# Patient Record
Sex: Male | Born: 1960 | Race: White | Hispanic: No | Marital: Married | Smoking: Current every day smoker
Health system: Southern US, Community
[De-identification: ages and names within clinical notes are randomized; demographics above are authoritative.]

## PROBLEM LIST (undated history)

## (undated) DIAGNOSIS — I10 Essential (primary) hypertension: Secondary | ICD-10-CM

## (undated) DIAGNOSIS — E119 Type 2 diabetes mellitus without complications: Secondary | ICD-10-CM

## (undated) DIAGNOSIS — M1990 Unspecified osteoarthritis, unspecified site: Secondary | ICD-10-CM

## (undated) HISTORY — DX: Essential (primary) hypertension: I10

## (undated) HISTORY — DX: Type 2 diabetes mellitus without complications: E11.9

## (undated) HISTORY — PX: ORCHIECTOMY: SHX2116

---

## 2008-05-12 ENCOUNTER — Encounter: Admission: RE | Admit: 2008-05-12 | Discharge: 2008-05-12 | Payer: Self-pay | Admitting: Internal Medicine

## 2009-06-07 ENCOUNTER — Encounter: Admission: RE | Admit: 2009-06-07 | Discharge: 2009-06-07 | Payer: Self-pay | Admitting: Family Medicine

## 2009-10-23 IMAGING — CR DG CHEST 2V
2 series · 2 of 2 positions shown · non-contrast
Comparison: None

CLINICAL DATA: Physical clearance.

CHEST - 2 VIEW

[view not recorded (1 of 2)]
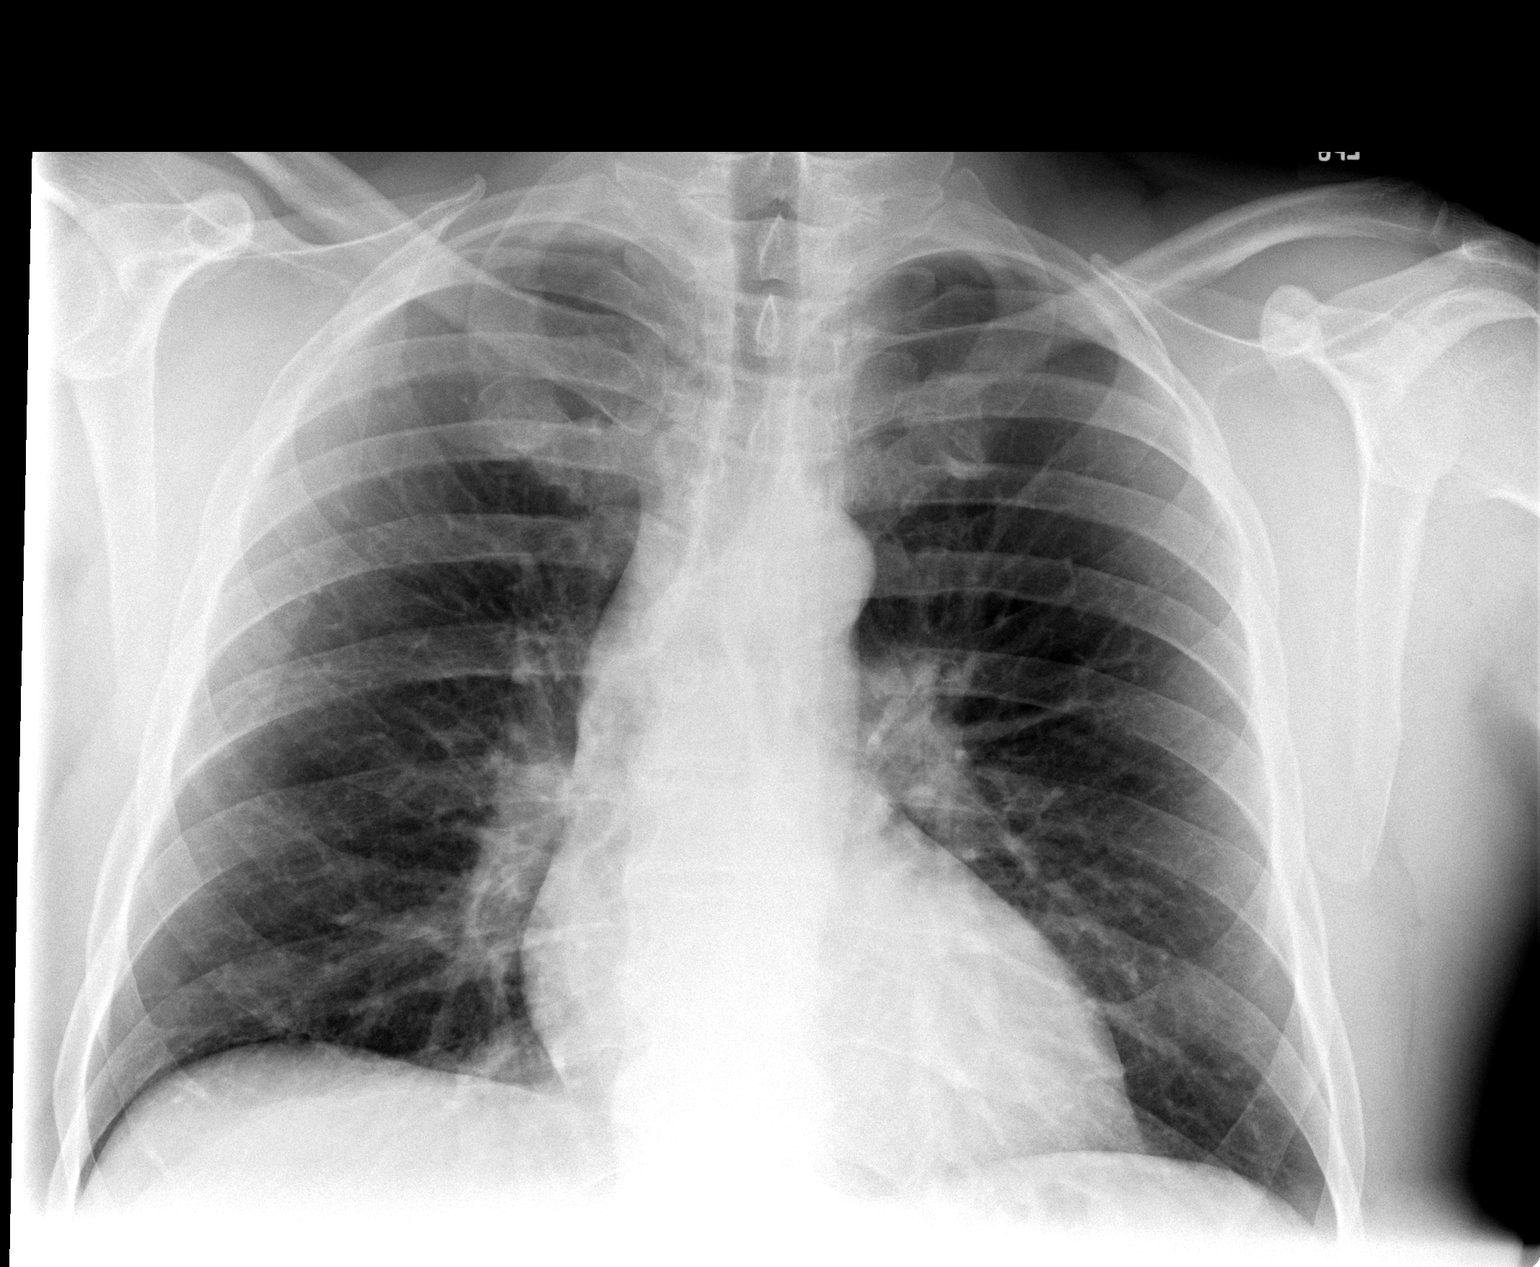

[view not recorded (2 of 2)]
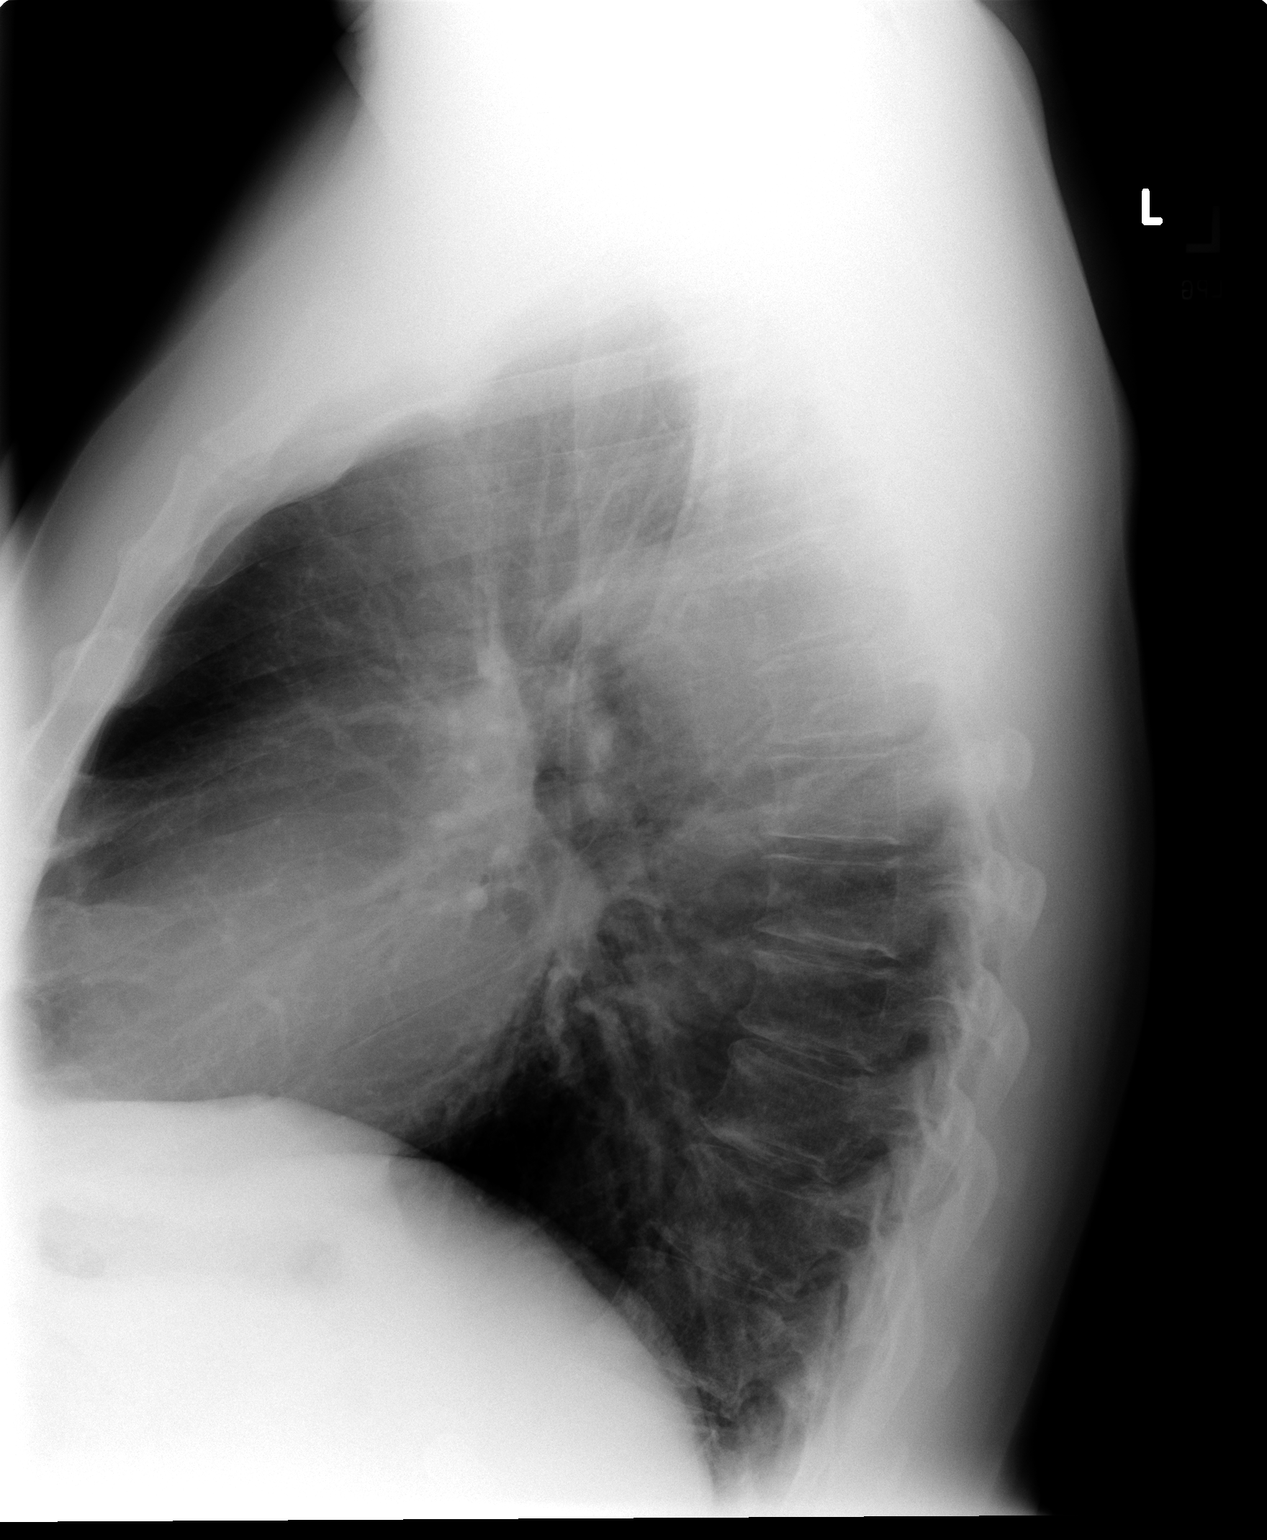

[2 of 2 positions shown; findings below may reference images not displayed]

FINDINGS: The heart size is normal and the vascularity is normal.
The lungs are clear and  there is no infiltrate or effusion.  There
is COPD with pulmonary hyperinflation.
IMPRESSION: No active cardiopulmonary disease.

## 2010-07-16 ENCOUNTER — Encounter: Admission: RE | Admit: 2010-07-16 | Discharge: 2010-07-16 | Payer: Self-pay | Admitting: Occupational Medicine

## 2011-12-27 IMAGING — CR DG CHEST 1V
1 series · 1 of 1 positions shown · non-contrast
Comparison: 06/07/2009

CLINICAL DATA: Physical examination.  Occasional tobacco use.

CHEST - 1 VIEW

[view not recorded]
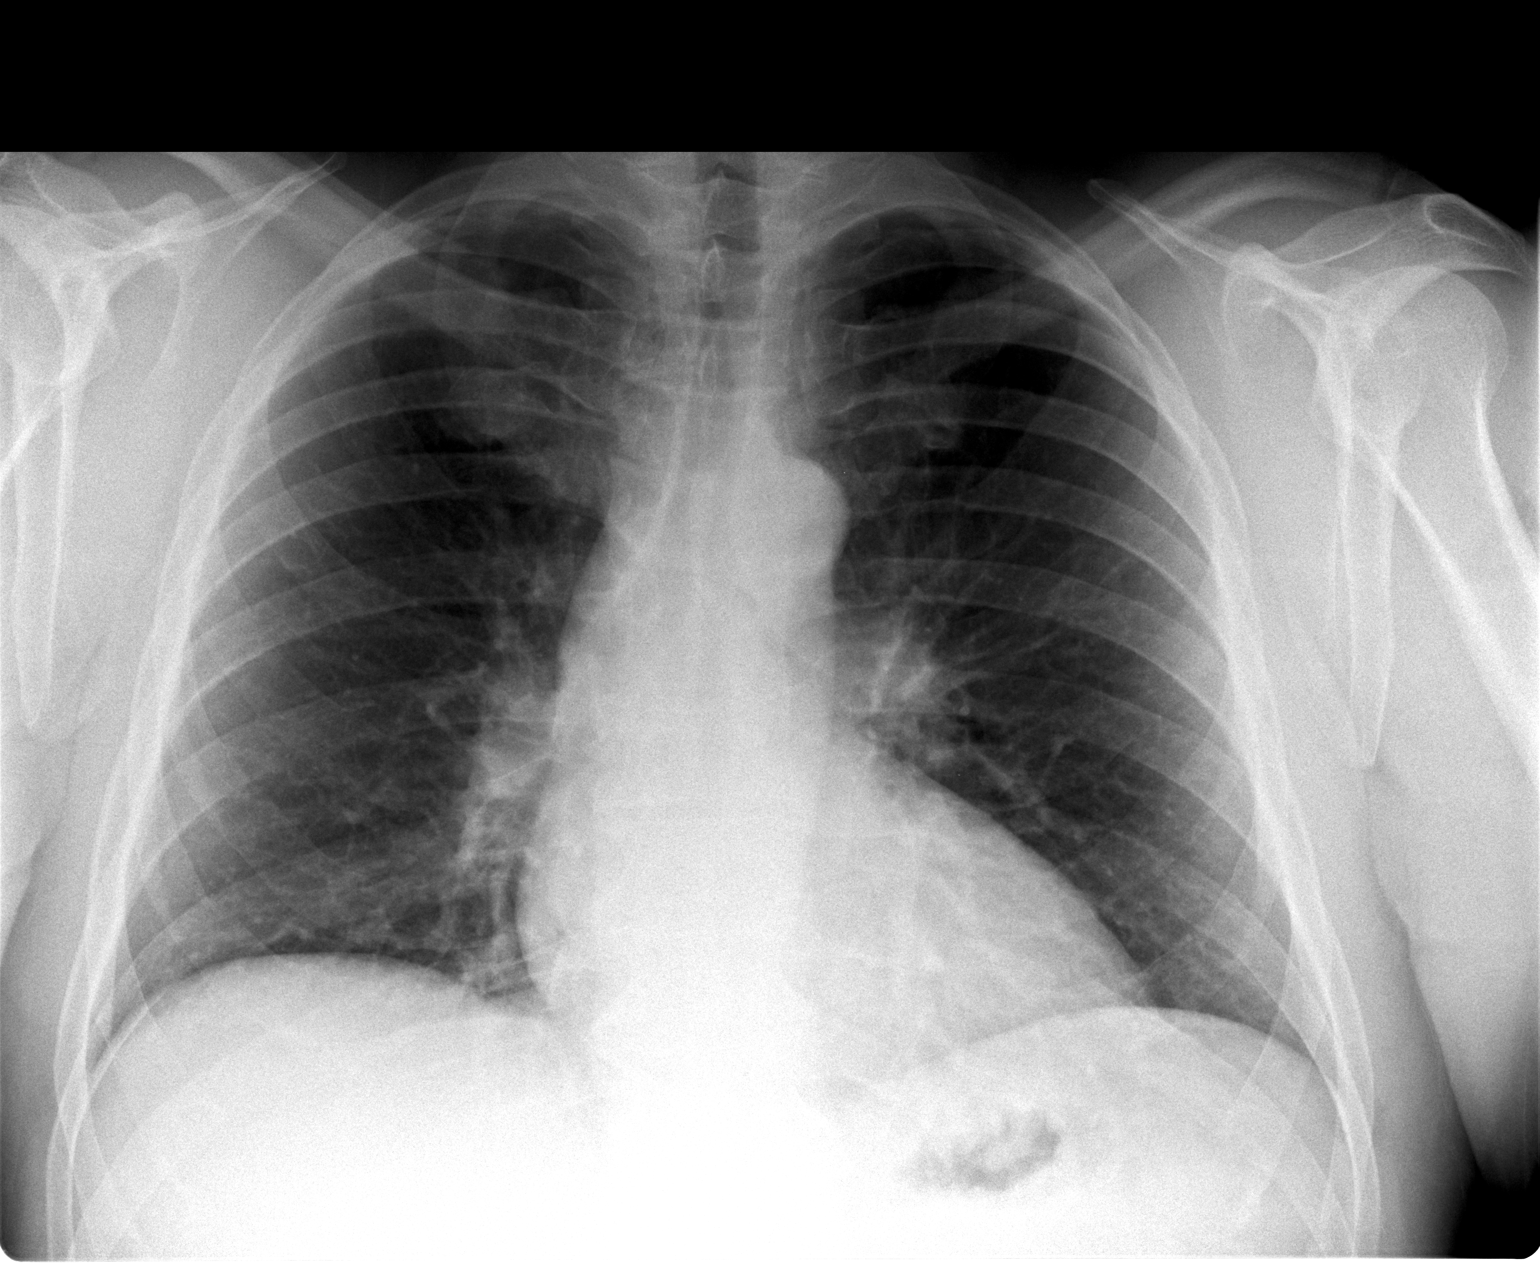

[1 of 1 positions shown; findings below may reference images not displayed]

FINDINGS: The heart size and mediastinal contours are within normal
limits.  Both lungs are clear.  Visualized bony thorax is
unremarkable.
IMPRESSION: No active disease.

## 2022-01-10 ENCOUNTER — Ambulatory Visit (INDEPENDENT_AMBULATORY_CARE_PROVIDER_SITE_OTHER): Payer: Self-pay | Admitting: Urology

## 2022-01-10 ENCOUNTER — Other Ambulatory Visit: Payer: Self-pay

## 2022-01-10 ENCOUNTER — Encounter: Payer: Self-pay | Admitting: Urology

## 2022-01-10 VITALS — BP 163/106 | HR 75

## 2022-01-10 LAB — URINALYSIS, ROUTINE W REFLEX MICROSCOPIC
Bilirubin, UA: NEGATIVE
Ketones, UA: NEGATIVE
Leukocytes,UA: NEGATIVE
Nitrite, UA: NEGATIVE
Protein,UA: NEGATIVE
RBC, UA: NEGATIVE
Specific Gravity, UA: 1.02 (ref 1.005–1.030)
Urobilinogen, Ur: 0.2 mg/dL (ref 0.2–1.0)
pH, UA: 6.5 (ref 5.0–7.5)

## 2022-01-10 LAB — BLADDER SCAN AMB NON-IMAGING: Scan Result: 222

## 2022-01-10 NOTE — Progress Notes (Signed)
PVR 222 ?

## 2022-01-10 NOTE — Progress Notes (Signed)
? ?Assessment: ?1. BPH with obstruction/lower urinary tract symptoms   ?2. Organic impotence   ?3. Peyronie's disease   ?4. Decreased libido   ? ?  ?Plan: ?I reviewed his records from [facility] including prior urology notes, lab results. ?Testosterone level today ?Diagnosis and management options for BPH with obstruction discussed.  He is not responding to medical therapy.  I discussed options including minimally invasive procedures as well as surgical management with TURP, PVP, simple prostatectomy, or HoLEP. ?Recommend further evaluation with cystoscopy and prostate ultrasound for volume. ?I also discussed diagnosis and management of Peyronie's disease.  I advised him that treatment is not recommended in an acute phase when there is pain and ongoing change in the curvature. ?Continue tamsulosin, finasteride, and tadalafil for now. ?Return to office in near future for cystoscopy evaluation. ? ? ?Chief Complaint:  ?Chief Complaint  ?Patient presents with  ? Benign Prostatic Hypertrophy  ? ? ?History of Present Illness: ? ?Mark Kim is a 61 y.o. year old male who is seen for evaluation of BPH with obstruction and organic impotence.  He was previously followed in [facility] with his last visit in June 2021. ?He returns today for follow-up.  He is no longer on Rapaflo.  He has resumed tamsulosin.  He does not feel like his urinary symptoms are well controlled.  He reports urinary frequency, nocturia 1-2 times, intermittent stream, decreased force of stream, urgency, and sensation of incomplete emptying.  No dysuria or gross hematuria. ?He was started on finasteride in January 2023. ?IPSS = 22 today. ?PSA from 1/23: 0.91 ? ?He also continues to have problems achieving and maintaining his erections.  He has noted some pain associated with his erections as well as a dorsal curvature.  This has been ongoing for the past 18 months.  He is able to achieve an erection and use this for intercourse with the  assistance of daily tadalafil.  He does report a decreased libido. ? ?Urologic History: ?He has a history of BPH with obstruction.  He previously had tried alfuzosin but did not notice significant benefit and had ejaculatory problems. He was taking Cialis 5 mg daily with good results as well as Rapaflo. ?Due to worsening of his lower urinary tract symptoms, he was restarted on tamsulosin in October 2020. ?AUA score = 13. ?PSA 10/20: 0.78 ?He was changed back to Rapaflo at his visit in April 2021. ?He noted improvement in his urinary symptoms with the Rapaflo. No side effects. ?AUA score = 6. ? ?He has a history of difficulty achieving and maintaining his erections. No pain or curvature with erection. He also had premature ejaculation, occurring after 2 minutes of penetration. He was on tadalafil 5 mg daily. He was given a trial of tadalafil 20 mg as needed at his visit in April 2021. ?He noted improvement in his erections with the increased dose of tadalafil. He also noted improvement in the premature ejaculation. ? ?He continued on Rapaflo 8 mg daily and tadalafil 20 mg as needed at his visit in June 2021. ? ?Past Medical History:  ?Past Medical History:  ?Diagnosis Date  ? Diabetes mellitus without complication (HCC)   ? Hypertension   ? ? ?Past Surgical History:  ?Past Surgical History:  ?Procedure Laterality Date  ? ORCHIECTOMY    ? ? ?Allergies:  ?No Known Allergies ? ?Family History:  ?History reviewed. No pertinent family history. ? ?Social History:  ?Social History  ? ?Tobacco Use  ? Smoking status: Every  Day  ?  Types: Cigarettes  ? Smokeless tobacco: Never  ?Substance Use Topics  ? Alcohol use: Not Currently  ? ? ?Review of symptoms:  ?Constitutional:  Negative for unexplained weight loss, night sweats, fever, chills ?ENT:  Negative for nose bleeds, sinus pain, painful swallowing ?CV:  Negative for chest pain, shortness of breath, exercise intolerance, palpitations, loss of consciousness ?Resp:  Negative  for cough, wheezing, shortness of breath ?GI:  Negative for nausea, vomiting, diarrhea, bloody stools ?GU:  Positives noted in HPI; otherwise negative for gross hematuria, dysuria ?Neuro:  Negative for seizures, poor balance, limb weakness, slurred speech ?Psych:  Negative for lack of energy, depression, anxiety ?Endocrine:  Negative for polydipsia, polyuria, symptoms of hypoglycemia (dizziness, hunger, sweating) ?Hematologic:  Negative for anemia, purpura, petechia, prolonged or excessive bleeding, use of anticoagulants  ?Allergic:  Negative for difficulty breathing or choking as a result of exposure to anything; no shellfish allergy; no allergic response (rash/itch) to materials, foods ? ?Physical exam: ?BP (!) 163/106   Pulse 75  ?GENERAL APPEARANCE:  Well appearing, well developed, well nourished, NAD ?HEENT: Atraumatic, Normocephalic, oropharynx clear. ?NECK: Supple without lymphadenopathy or thyromegaly. ?LUNGS: Clear to auscultation bilaterally. ?HEART: Regular Rate and Rhythm without murmurs, gallops, or rubs. ?ABDOMEN: Soft, non-tender, No Masses. ?EXTREMITIES: Moves all extremities well.  Without clubbing, cyanosis, or edema. ?NEUROLOGIC:  Alert and oriented x 3, normal gait, CN II-XII grossly intact.  ?MENTAL STATUS:  Appropriate. ?BACK:  Non-tender to palpation.  No CVAT ?SKIN:  Warm, dry and intact.   ?GU: ?Penis: Circumcised; small plaque palpated on dorsal shaft ?Meatus: Normal ?Scrotum: No edema or erythema ?Testis: Solitary testicle without tenderness or mass ?Epididymis: normal ?Prostate: 50 g gland without tenderness or nodularity ?Rectum: Normal tone,  no masses or tenderness ? ? ?Results: ?U/A dipstick negative ? ?PVR:  222 ml ?

## 2022-01-11 LAB — TESTOSTERONE: Testosterone: 629 ng/dL (ref 264–916)

## 2022-01-14 ENCOUNTER — Encounter: Payer: Self-pay | Admitting: Urology

## 2022-01-31 ENCOUNTER — Encounter: Payer: Self-pay | Admitting: Urology

## 2022-01-31 ENCOUNTER — Ambulatory Visit (INDEPENDENT_AMBULATORY_CARE_PROVIDER_SITE_OTHER): Payer: Managed Care, Other (non HMO) | Admitting: Urology

## 2022-01-31 VITALS — BP 113/73 | HR 80

## 2022-01-31 DIAGNOSIS — N401 Enlarged prostate with lower urinary tract symptoms: Secondary | ICD-10-CM

## 2022-01-31 DIAGNOSIS — N138 Other obstructive and reflux uropathy: Secondary | ICD-10-CM

## 2022-01-31 DIAGNOSIS — N529 Male erectile dysfunction, unspecified: Secondary | ICD-10-CM

## 2022-01-31 DIAGNOSIS — N486 Induration penis plastica: Secondary | ICD-10-CM

## 2022-01-31 LAB — URINALYSIS, ROUTINE W REFLEX MICROSCOPIC
Bilirubin, UA: NEGATIVE
Ketones, UA: NEGATIVE
Leukocytes,UA: NEGATIVE
Nitrite, UA: NEGATIVE
Protein,UA: NEGATIVE
RBC, UA: NEGATIVE
Specific Gravity, UA: 1.02 (ref 1.005–1.030)
Urobilinogen, Ur: 0.2 mg/dL (ref 0.2–1.0)
pH, UA: 5.5 (ref 5.0–7.5)

## 2022-01-31 MED ORDER — CIPROFLOXACIN HCL 500 MG PO TABS
500.0000 mg | ORAL_TABLET | Freq: Once | ORAL | Status: AC
  Administered 2022-01-31: 500 mg via ORAL

## 2022-01-31 NOTE — Progress Notes (Signed)
? ?Assessment: ?1. BPH with obstruction/lower urinary tract symptoms   ?2. Organic impotence   ?3. Peyronie's disease   ? ? ?  ?Plan: ?Diagnosis and management options for BPH with obstruction discussed.  He is not responding to medical therapy.  I discussed options including minimally invasive procedures (UroLift) as well as surgical management with TURP, PVP, simple prostatectomy, or HoLEP. ?Recommend further evaluation with prostate ultrasound for volume. ?I also discussed diagnosis and management of Peyronie's disease.  I advised him that treatment is not recommended in an acute phase when there is pain and ongoing change in the curvature. ?Continue tamsulosin, finasteride, and tadalafil for now. ?Cipro x1 following cystoscopy ? ?Chief Complaint:  ?Chief Complaint  ?Patient presents with  ? Benign Prostatic Hypertrophy  ? ? ?History of Present Illness: ? ?Mark Kim is a 61 y.o. year old male who is seen for evaluation of BPH with obstruction and organic impotence.  He was previously followed in [facility] with his last visit in June 2021. ?He discontinued Rapaflo and resumed tamsulosin.  He does not feel like his urinary symptoms are well controlled.  He reports urinary frequency, nocturia 1-2 times, intermittent stream, decreased force of stream, urgency, and sensation of incomplete emptying.  No dysuria or gross hematuria. ?He was started on finasteride in January 2023. ?IPSS = 22.  PVR = 222 ml ?PSA from 1/23: 0.91 ? ?He also continues to have problems achieving and maintaining his erections.  He has noted some pain associated with his erections as well as a dorsal curvature.  This has been ongoing for the past 18 months.  He is able to achieve an erection and use this for intercourse with the assistance of daily tadalafil.  He does report a decreased libido. ?Testosterone 3/23:  629 ? ?He returns today for further evaluation with cystoscopy.  His urinary symptoms remain unchanged.  He continues with  sensation of incomplete emptying, decreased force of stream, frequency, urgency, and intermittent stream.  No dysuria or gross hematuria. ?IPSS = 17. ? ?Urologic History: ?He has a history of BPH with obstruction.  He previously had tried alfuzosin but did not notice significant benefit and had ejaculatory problems. He was taking Cialis 5 mg daily with good results as well as Rapaflo. ?Due to worsening of his lower urinary tract symptoms, he was restarted on tamsulosin in October 2020. ?AUA score = 13. ?PSA 10/20: 0.78 ?He was changed back to Rapaflo at his visit in April 2021. ?He noted improvement in his urinary symptoms with the Rapaflo. No side effects. ?AUA score = 6. ? ?He has a history of difficulty achieving and maintaining his erections. No pain or curvature with erection. He also had premature ejaculation, occurring after 2 minutes of penetration. He was on tadalafil 5 mg daily. He was given a trial of tadalafil 20 mg as needed at his visit in April 2021. ?He noted improvement in his erections with the increased dose of tadalafil. He also noted improvement in the premature ejaculation. ? ?He continued on Rapaflo 8 mg daily and tadalafil 20 mg as needed at his visit in June 2021. ? ? ?Past Medical History:  ?Past Medical History:  ?Diagnosis Date  ? Diabetes mellitus without complication (HCC)   ? Hypertension   ? ? ?Past Surgical History:  ?Past Surgical History:  ?Procedure Laterality Date  ? ORCHIECTOMY    ? ? ?Allergies:  ?No Known Allergies ? ?Family History:  ?No family history on file. ? ?Social History:  ?  Social History  ? ?Tobacco Use  ? Smoking status: Every Day  ?  Types: Cigarettes  ? Smokeless tobacco: Never  ?Substance Use Topics  ? Alcohol use: Not Currently  ? ? ?ROS: ?Constitutional:  Negative for fever, chills, weight loss ?CV: Negative for chest pain, previous MI, hypertension ?Respiratory:  Negative for shortness of breath, wheezing, sleep apnea, frequent cough ?GI:  Negative for nausea,  vomiting, bloody stool, GERD ? ?Physical exam: ?BP 113/73   Pulse 80  ?GENERAL APPEARANCE:  Well appearing, well developed, well nourished, NAD ?HEENT:  Atraumatic, normocephalic, oropharynx clear ?NECK:  Supple without lymphadenopathy or thyromegaly ?ABDOMEN:  Soft, non-tender, no masses ?EXTREMITIES:  Moves all extremities well, without clubbing, cyanosis, or edema ?NEUROLOGIC:  Alert and oriented x 3, normal gait, CN II-XII grossly intact ?MENTAL STATUS:  appropriate ?BACK:  Non-tender to palpation, No CVAT ?SKIN:  Warm, dry, and intact ? ? ?Results: ?U/A dipstick negative. ? ?Procedure:  Flexible Cystourethroscopy ? ?Pre-operative Diagnosis: Lower urinary tract symptoms ? ?Post-operative Diagnosis: Lower urinary tract symptoms ? ?Anesthesia:  local with lidocaine jelly ? ?Surgical Narrative: ? ?After appropriate informed consent was obtained, the patient was prepped and draped in the usual sterile fashion in the supine position.  The patient was correctly identified and the proper procedure delineated prior to proceeding.  Sterile lidocaine gel was instilled in the urethra. ?The flexible cystoscope was introduced without difficulty. ? ?Findings: ? ?Anterior urethra: Normal ? ?Posterior urethra: Lateral lobe hypertrophy, no signficant median lobe ? ?Bladder:  1+ trabeculations, no mucosal lesions ? ?Ureteral orifices: normal ? ?Additional findings:  none ? ?Saline bladder wash for cytology was not performed.   ? ?The cystoscope was then removed.  The patient tolerated the procedure well. ? ? ?

## 2022-02-21 ENCOUNTER — Encounter: Payer: Self-pay | Admitting: Urology

## 2022-02-21 ENCOUNTER — Ambulatory Visit (HOSPITAL_COMMUNITY)
Admission: RE | Admit: 2022-02-21 | Discharge: 2022-02-21 | Disposition: A | Discharge status: Home / Self Care | Payer: Managed Care, Other (non HMO) | Source: Ambulatory Visit | Attending: Urology | Admitting: Urology

## 2022-02-21 ENCOUNTER — Ambulatory Visit: Payer: Managed Care, Other (non HMO) | Admitting: Urology

## 2022-02-21 VITALS — BP 143/87 | HR 76

## 2022-02-21 DIAGNOSIS — N401 Enlarged prostate with lower urinary tract symptoms: Secondary | ICD-10-CM | POA: Diagnosis present

## 2022-02-21 DIAGNOSIS — N529 Male erectile dysfunction, unspecified: Secondary | ICD-10-CM | POA: Diagnosis not present

## 2022-02-21 DIAGNOSIS — N138 Other obstructive and reflux uropathy: Secondary | ICD-10-CM

## 2022-02-21 LAB — URINALYSIS, ROUTINE W REFLEX MICROSCOPIC
Bilirubin, UA: NEGATIVE
Ketones, UA: NEGATIVE
Leukocytes,UA: NEGATIVE
Nitrite, UA: NEGATIVE
RBC, UA: NEGATIVE
Specific Gravity, UA: 1.02 (ref 1.005–1.030)
Urobilinogen, Ur: 1 mg/dL (ref 0.2–1.0)
pH, UA: 7 (ref 5.0–7.5)

## 2022-02-21 LAB — BLADDER SCAN AMB NON-IMAGING: Scan Result: 65

## 2022-02-21 NOTE — Progress Notes (Signed)
? ?Assessment: ?1. BPH with obstruction/lower urinary tract symptoms   ?2. Organic impotence   ? ? ?Plan: ?Diagnosis and management options for BPH with obstruction discussed.  He is not responding to medical therapy.  I discussed options including minimally invasive procedures (UroLift) as well as surgical management with TURP, PVP, simple prostatectomy, or HoLEP. ?He would like to proceed with UroLift.  Risks and benefits discussed.  Information on procedure provided to patient today. ?Continue tamsulosin, finasteride, and tadalafil. ? ?Procedure: ?The patient will be scheduled for cystoscopy, UroLift procedure at [facility].  Surgical request is placed with the surgery schedulers and will be scheduled at the patient's/family request. Informed consent is given as documented below. ?Anesthesia: MAC ? ?The patient does not have sleep apnea, history of MRSA, history of VRE, history of cardiac device requiring special anesthetic needs. ?Patient is stable and considered clear for surgical in an outpatient ambulatory surgery setting as well as patient hospital setting. ? ?Consent for Operation or Procedure: Provider Certification ?I hereby certify that the nature, purpose, benefits, usual and most frequent risks of, and alternatives to, the operation or procedure have been explained to the patient (or person authorized to sign for the patient) either by me as responsible physician or by the provider who is to perform the operation or procedure. Time spent such that the patient/family has had an opportunity to ask questions, and that those questions have been answered. The patient or the patient's representative has been advised that selected tasks may be performed by assistants to the primary health care provider(s). I believe that the patient (or person authorized to sign for the patient) understands what has been explained, and has consented to the operation or procedure. No guarantees were implied or made. ? ?   ? ?Chief Complaint:  ?Chief Complaint  ?Patient presents with  ? Benign Prostatic Hypertrophy  ? ? ?History of Present Illness: ? ?Mark Kim is a 61 y.o. year old male who is seen for evaluation of BPH with obstruction and organic impotence.  He was previously followed in [facility] with his last visit in June 2021. ?He discontinued Rapaflo and resumed tamsulosin.  He does not feel like his urinary symptoms are well controlled.  He reports urinary frequency, nocturia 1-2 times, intermittent stream, decreased force of stream, urgency, and sensation of incomplete emptying.  No dysuria or gross hematuria. ?He was started on finasteride in January 2023. ?IPSS = 22.  PVR = 222 ml ?PSA from 1/23: 0.91 ? ?He also continues to have problems achieving and maintaining his erections.  He has noted some pain associated with his erections as well as a dorsal curvature.  This has been ongoing for the past 18 months.  He is able to achieve an erection and use this for intercourse with the assistance of daily tadalafil.  He does report a decreased libido. ?Testosterone 3/23:  629 ?His urinary symptoms remained unchanged.  He continued with sensation of incomplete emptying, decreased force of stream, frequency, urgency, and intermittent stream.  No dysuria or gross hematuria. ?IPSS = 17. ?Cystoscopy demonstrated lateral lobe enlargement of the prostate without a median lobe, 1+ bladder trabeculations. ?He underwent prostate ultrasound today for surgical planning.   ?TRUS volume: 28 ml ? ?He returns today for follow-up.  He continues on tamsulosin, finasteride, and tadalafil.  His urinary symptoms are unchanged.  He continues to report a decreased force of stream and sensation of incomplete emptying.  No dysuria or gross hematuria. ?IPSS = 14  today. ? ?Urologic History: ?He has a history of BPH with obstruction.  He previously had tried alfuzosin but did not notice significant benefit and had ejaculatory problems. He was  taking Cialis 5 mg daily with good results as well as Rapaflo. ?Due to worsening of his lower urinary tract symptoms, he was restarted on tamsulosin in October 2020. ?AUA score = 13. ?PSA 10/20: 0.78 ?He was changed back to Rapaflo at his visit in April 2021. ?He noted improvement in his urinary symptoms with the Rapaflo. No side effects. ?AUA score = 6. ? ?He has a history of difficulty achieving and maintaining his erections. No pain or curvature with erection. He also had premature ejaculation, occurring after 2 minutes of penetration. He was on tadalafil 5 mg daily. He was given a trial of tadalafil 20 mg as needed at his visit in April 2021. ?He noted improvement in his erections with the increased dose of tadalafil. He also noted improvement in the premature ejaculation. ? ?He continued on Rapaflo 8 mg daily and tadalafil 20 mg as needed at his visit in June 2021. ? ? ?Past Medical History:  ?Past Medical History:  ?Diagnosis Date  ? Diabetes mellitus without complication ([place])   ? Hypertension   ? ? ?Past Surgical History:  ?Past Surgical History:  ?Procedure Laterality Date  ? ORCHIECTOMY    ? ? ?Allergies:  ?No Known Allergies ? ?Family History:  ?No family history on file. ? ?Social History:  ?Social History  ? ?Tobacco Use  ? Smoking status: Every Day  ?  Types: Cigarettes  ? Smokeless tobacco: Never  ?Substance Use Topics  ? Alcohol use: Not Currently  ? ? ?ROS: ?Constitutional:  Negative for fever, chills, weight loss ?CV: Negative for chest pain, previous MI, hypertension ?Respiratory:  Negative for shortness of breath, wheezing, sleep apnea, frequent cough ?GI:  Negative for nausea, vomiting, bloody stool, GERD ? ?Physical exam: ?BP (!) 143/87   Pulse 76  ?GENERAL APPEARANCE:  Well appearing, well developed, well nourished, NAD ?HEENT: Atraumatic, Normocephalic, oropharynx clear. ?NECK: Supple without lymphadenopathy or thyromegaly. ?LUNGS: Clear to auscultation bilaterally. ?HEART: Regular Rate and  Rhythm without murmurs, gallops, or rubs. ?ABDOMEN: Soft, non-tender, No Masses. ?EXTREMITIES: Moves all extremities well.  Without clubbing, cyanosis, or edema. ?NEUROLOGIC:  Alert and oriented x 3, normal gait, CN II-XII grossly intact.  ?MENTAL STATUS:  Appropriate. ?BACK:  Non-tender to palpation.  No CVAT ?SKIN:  Warm, dry and intact.   ? ? ?Results: ?U/A dipstick negative ? ?PVR = 65 mL ? ? ? ?

## 2022-02-22 ENCOUNTER — Telehealth: Payer: Self-pay

## 2022-02-22 NOTE — Telephone Encounter (Signed)
I spoke with Mr. Walrond. We have discussed possible surgery dates and 04/03/2022 was agreed upon by all parties. Patient given information about surgery date, what to expect pre-operatively and post operatively.  ?  ?We discussed that a pre-op nurse will be calling to set up the pre-op visit that will take place prior to surgery. Informed patient that our office will communicate any additional care to be provided after surgery.  ?  ?Patients questions or concerns were discussed during our call. Advised to call our office should there be any additional information, questions or concerns that arise. Patient verbalized understanding.   ?

## 2022-03-21 ENCOUNTER — Telehealth: Payer: Self-pay

## 2022-03-21 ENCOUNTER — Other Ambulatory Visit: Payer: Self-pay

## 2022-03-21 ENCOUNTER — Other Ambulatory Visit: Payer: Self-pay | Admitting: Urology

## 2022-03-21 DIAGNOSIS — N138 Other obstructive and reflux uropathy: Secondary | ICD-10-CM

## 2022-03-21 MED ORDER — SILODOSIN 8 MG PO CAPS: 8.0000 mg | ORAL_CAPSULE | Freq: Every day | ORAL | 11 refills | Status: DC

## 2022-03-21 NOTE — Telephone Encounter (Signed)
Rx sent 

## 2022-03-21 NOTE — Telephone Encounter (Signed)
Patient called back wanting to check on status of RX. Medication is Rapeflo.      Ph[facility]: [facility] [address], [place], [place] [address]

## 2022-03-21 NOTE — Telephone Encounter (Signed)
Patient left a voice mail today.  He is out of medication. Did not leave name of Rx.  Please call back:  [phone]  Thanks, Rosey Bath

## 2022-03-21 NOTE — Telephone Encounter (Signed)
Is patient suppose to be on Rapaflo? Medication not listed to continue per last OV note. Please advise.

## 2022-04-01 NOTE — Patient Instructions (Signed)
Mark Kim  04/01/2022     @   Your procedure is scheduled on  04/03/2022.   Report to [facility] at  0600 A.M.   Call this number if you have problems the morning of surgery:  [phone]   Remember:  Do not eat or drink after midnight.     Take these medicines the morning of surgery with A SIP OF WATER                                           prilosec.     Do not wear jewelry, make-up or nail polish.  Do not wear lotions, powders, or perfumes, or deodorant.  Do not shave 48 hours prior to surgery.  Men may shave face and neck.  Do not bring valuables to the hospital.  [facility] is not responsible for any belongings or valuables.  Contacts, dentures or bridgework may not be worn into surgery.  Leave your suitcase in the car.  After surgery it may be brought to your room.  For patients admitted to the hospital, discharge time will be determined by your treatment team.  Patients discharged the day of surgery will not be allowed to drive home and must have someone with them for 24 hours.    Special instructions:   DO NOT smoke tobacco or vape for 24 hours before your procedure.  Please read over the following fact sheets that you were given. Coughing and Deep Breathing, Surgical Site Infection Prevention, Anesthesia Post-op Instructions, and Care and Recovery After Surgery      Prostatic Urethral Lift, Care After The following information offers guidance on how to care for yourself after your procedure. Your health care provider may also give you more specific instructions. If you have problems or questions, contact your health care provider. What can I expect after the procedure? After the procedure, it is common to have: Soreness or discomfort in your penis from having the cystoscope inserted during the procedure. Discomfort or burning when urinating. An increased urge to urinate. More frequent urination. Urine that is  blood-tinged. These symptoms should go away after a few days. Follow these instructions at home: Activity  If you were given a sedative during the procedure, it can affect you for several hours. Do not drive or operate machinery until your health care provider says that it is safe. Avoid sitting for a long time without moving. Get up to take short walks every 1-2 hours. This is important to improve blood flow and breathing. Ask for help if you feel weak or unsteady. You may have to avoid lifting. Ask your health care provider how much you can safely lift. Avoid intense physical activity for as long as told by your health care provider. Return to your normal activities as told by your health care provider. Ask your health care provider what activities are safe for you. Ask when you can return to sexual activity. General instructions  Take over-the-counter and prescription medicines only as told by your health care provider. Ask your health care provider if the medicine prescribed to you: Requires you to avoid driving or using machinery. Can cause constipation. You may need to take these actions to prevent or treat constipation: Drink enough fluid to keep your urine pale yellow. Take over-the-counter or prescription medicines. Eat foods that are  high in fiber, such as beans, whole grains, and fresh fruits and vegetables. Limit foods that are high in fat and processed sugars, such as fried or sweet foods. Do not use any products that contain nicotine or tobacco. These products include cigarettes, chewing tobacco, and vaping devices, such as e-cigarettes. These can delay healing after the procedure. If you need help quitting, ask your health care provider. Keep all follow-up visits. This is important. Contact a health care provider if: You have chills or a fever. You have pain when passing urine. You have bright red blood or blood clots in your urine. You have difficulty passing urine. You  have leaking of urine (incontinence). Get help right away if: You have chest pain or shortness of breath. You have leg pain or swelling. You cannot pass urine. These symptoms may be an emergency. Get help right away. Call 911. Do not wait to see if the symptoms will go away. Do not drive yourself to the hospital. Summary After the procedure, it is common to have discomfort or burning when urinating, an increased urge to urinate, more frequent urination, and urine that is blood-tinged. You may have to avoid lifting. Ask your health care provider how much you can safely lift. Return to your normal activities as told by your health care provider. Ask when you can return to sexual activity. This information is not intended to replace advice given to you by your health care provider. Make sure you discuss any questions you have with your health care provider. Document Revised: 05/11/2021 Document Reviewed: 05/11/2021 Elsevier Patient Education  2023 [employer] Inc[employer] General Anesthesia, Adult, Care After This sheet gives you information about how to care for yourself after your procedure. Your health care provider may also give you more specific instructions. If you have problems or questions, contact your health care provider. What can I expect after the procedure? After the procedure, the following side effects are common: Pain or discomfort at the IV site. Nausea. Vomiting. Sore throat. Trouble concentrating. Feeling cold or chills. Feeling weak or tired. Sleepiness and fatigue. Soreness and body aches. These side effects can affect parts of the body that were not involved in surgery. Follow these instructions at home: For the time period you were told by your health care provider:  Rest. Do not participate in activities where you could fall or become injured. Do not drive or use machinery. Do not drink alcohol. Do not take sleeping pills or medicines that cause drowsiness. Do not make  important decisions or sign legal documents. Do not take care of children on your own. Eating and drinking Follow any instructions from your health care provider about eating or drinking restrictions. When you feel hungry, start by eating small amounts of foods that are soft and easy to digest (bland), such as toast. Gradually return to your regular diet. Drink enough fluid to keep your urine pale yellow. If you vomit, rehydrate by drinking water, juice, or clear broth. General instructions If you have sleep apnea, surgery and certain medicines can increase your risk for breathing problems. Follow instructions from your health care provider about wearing your sleep device: Anytime you are sleeping, including during daytime naps. While taking prescription pain medicines, sleeping medicines, or medicines that make you drowsy. Have a responsible adult stay with you for the time you are told. It is important to have someone help care for you until you are awake and alert. Return to your normal activities as told by your health care  provider. Ask your health care provider what activities are safe for you. Take over-the-counter and prescription medicines only as told by your health care provider. If you smoke, do not smoke without supervision. Keep all follow-up visits as told by your health care provider. This is important. Contact a health care provider if: You have nausea or vomiting that does not get better with medicine. You cannot eat or drink without vomiting. You have pain that does not get better with medicine. You are unable to pass urine. You develop a skin rash. You have a fever. You have redness around your IV site that gets worse. Get help right away if: You have difficulty breathing. You have chest pain. You have blood in your urine or stool, or you vomit blood. Summary After the procedure, it is common to have a sore throat or nausea. It is also common to feel tired. Have a  responsible adult stay with you for the time you are told. It is important to have someone help care for you until you are awake and alert. When you feel hungry, start by eating small amounts of foods that are soft and easy to digest (bland), such as toast. Gradually return to your regular diet. Drink enough fluid to keep your urine pale yellow. Return to your normal activities as told by your health care provider. Ask your health care provider what activities are safe for you. This information is not intended to replace advice given to you by your health care provider. Make sure you discuss any questions you have with your health care provider. Document Revised: 06/29/2020 Document Reviewed: 01/27/2020 Elsevier Patient Education  2023 Elsevier Inc. How to Use Chlorhexidine for Bathing Chlorhexidine gluconate (CHG) is a germ-killing (antiseptic) solution that is used to clean the skin. It can get rid of the bacteria that normally live on the skin and can keep them away for about 24 hours. To clean your skin with CHG, you may be given: A CHG solution to use in the shower or as part of a sponge bath. A prepackaged cloth that contains CHG. Cleaning your skin with CHG may help lower the risk for infection: While you are staying in the intensive care unit of the hospital. If you have a vascular access, such as a central line, to provide short-term or long-term access to your veins. If you have a catheter to drain urine from your bladder. If you are on a ventilator. A ventilator is a machine that helps you breathe by moving air in and out of your lungs. After surgery. What are the risks? Risks of using CHG include: A skin reaction. Hearing loss, if CHG gets in your ears and you have a perforated eardrum. Eye injury, if CHG gets in your eyes and is not rinsed out. The CHG product catching fire. Make sure that you avoid smoking and flames after applying CHG to your skin. Do not use CHG: If you have  a chlorhexidine allergy or have previously reacted to chlorhexidine. On babies younger than 36 months of age. How to use CHG solution Use CHG only as told by your health care provider, and follow the instructions on the label. Use the full amount of CHG as directed. Usually, this is one bottle. During a shower Follow these steps when using CHG solution during a shower (unless your health care provider gives you different instructions): Start the shower. Use your normal soap and shampoo to wash your face and hair. Turn off the shower or  move out of the shower stream. Pour the CHG onto a clean washcloth. Do not use any type of brush or rough-edged sponge. Starting at your neck, lather your body down to your toes. Make sure you follow these instructions: If you will be having surgery, pay special attention to the part of your body where you will be having surgery. Scrub this area for at least 1 minute. Do not use CHG on your head or face. If the solution gets into your ears or eyes, rinse them well with water. Avoid your genital area. Avoid any areas of skin that have broken skin, cuts, or scrapes. Scrub your back and under your arms. Make sure to wash skin folds. Let the lather sit on your skin for 1-2 minutes or as long as told by your health care provider. Thoroughly rinse your entire body in the shower. Make sure that all body creases and crevices are rinsed well. Dry off with a clean towel. Do not put any substances on your body afterward--such as powder, lotion, or perfume--unless you are told to do so by your health care provider. Only use lotions that are recommended by the manufacturer. Put on clean clothes or pajamas. If it is the night before your surgery, sleep in clean sheets.  During a sponge bath Follow these steps when using CHG solution during a sponge bath (unless your health care provider gives you different instructions): Use your normal soap and shampoo to wash your face and  hair. Pour the CHG onto a clean washcloth. Starting at your neck, lather your body down to your toes. Make sure you follow these instructions: If you will be having surgery, pay special attention to the part of your body where you will be having surgery. Scrub this area for at least 1 minute. Do not use CHG on your head or face. If the solution gets into your ears or eyes, rinse them well with water. Avoid your genital area. Avoid any areas of skin that have broken skin, cuts, or scrapes. Scrub your back and under your arms. Make sure to wash skin folds. Let the lather sit on your skin for 1-2 minutes or as long as told by your health care provider. Using a different clean, wet washcloth, thoroughly rinse your entire body. Make sure that all body creases and crevices are rinsed well. Dry off with a clean towel. Do not put any substances on your body afterward--such as powder, lotion, or perfume--unless you are told to do so by your health care provider. Only use lotions that are recommended by the manufacturer. Put on clean clothes or pajamas. If it is the night before your surgery, sleep in clean sheets. How to use CHG prepackaged cloths Only use CHG cloths as told by your health care provider, and follow the instructions on the label. Use the CHG cloth on clean, dry skin. Do not use the CHG cloth on your head or face unless your health care provider tells you to. When washing with the CHG cloth: Avoid your genital area. Avoid any areas of skin that have broken skin, cuts, or scrapes. Before surgery Follow these steps when using a CHG cloth to clean before surgery (unless your health care provider gives you different instructions): Using the CHG cloth, vigorously scrub the part of your body where you will be having surgery. Scrub using a back-and-forth motion for 3 minutes. The area on your body should be completely wet with CHG when you are done scrubbing. Do not  rinse. Discard the cloth and  let the area air-dry. Do not put any substances on the area afterward, such as powder, lotion, or perfume. Put on clean clothes or pajamas. If it is the night before your surgery, sleep in clean sheets.  For general bathing Follow these steps when using CHG cloths for general bathing (unless your health care provider gives you different instructions). Use a separate CHG cloth for each area of your body. Make sure you wash between any folds of skin and between your fingers and toes. Wash your body in the following order, switching to a new cloth after each step: The front of your neck, shoulders, and chest. Both of your arms, under your arms, and your hands. Your stomach and groin area, avoiding the genitals. Your right leg and foot. Your left leg and foot. The back of your neck, your back, and your buttocks. Do not rinse. Discard the cloth and let the area air-dry. Do not put any substances on your body afterward--such as powder, lotion, or perfume--unless you are told to do so by your health care provider. Only use lotions that are recommended by the manufacturer. Put on clean clothes or pajamas. Contact a health care provider if: Your skin gets irritated after scrubbing. You have questions about using your solution or cloth. You swallow any chlorhexidine. Call your local poison control center ([phone] in the [place] Get help right away if: Your eyes itch badly, or they become very red or swollen. Your skin itches badly and is red or swollen. Your hearing changes. You have trouble seeing. You have swelling or tingling in your mouth or throat. You have trouble breathing. These symptoms may represent a serious problem that is an emergency. Do not wait to see if the symptoms will go away. Get medical help right away. Call your local emergency services (911 in the [place] Do not drive yourself to the hospital. Summary Chlorhexidine gluconate (CHG) is a germ-killing (antiseptic) solution  that is used to clean the skin. Cleaning your skin with CHG may help to lower your risk for infection. You may be given CHG to use for bathing. It may be in a bottle or in a prepackaged cloth to use on your skin. Carefully follow your health care provider's instructions and the instructions on the product label. Do not use CHG if you have a chlorhexidine allergy. Contact your health care provider if your skin gets irritated after scrubbing. This information is not intended to replace advice given to you by your health care provider. Make sure you discuss any questions you have with your health care provider. Document Revised: 12/25/2020 Document Reviewed: 12/25/2020 Elsevier Patient Education  2023 [employer]

## 2022-04-02 ENCOUNTER — Encounter (HOSPITAL_COMMUNITY): Payer: Self-pay

## 2022-04-02 ENCOUNTER — Encounter (HOSPITAL_COMMUNITY)
Admission: RE | Admit: 2022-04-02 | Discharge: 2022-04-02 | Disposition: A | Discharge status: Home / Self Care | Payer: Managed Care, Other (non HMO) | Source: Ambulatory Visit | Attending: Urology | Admitting: Urology

## 2022-04-02 VITALS — BP 123/89 | HR 66 | Temp 97.8°F | Resp 18

## 2022-04-02 DIAGNOSIS — Z01818 Encounter for other preprocedural examination: Secondary | ICD-10-CM | POA: Insufficient documentation

## 2022-04-02 DIAGNOSIS — E119 Type 2 diabetes mellitus without complications: Secondary | ICD-10-CM | POA: Insufficient documentation

## 2022-04-02 DIAGNOSIS — I10 Essential (primary) hypertension: Secondary | ICD-10-CM

## 2022-04-02 HISTORY — DX: Unspecified osteoarthritis, unspecified site: M19.90

## 2022-04-02 LAB — HEMOGLOBIN A1C
Hgb A1c MFr Bld: 7.8 % — ABNORMAL HIGH (ref 4.8–5.6)
Mean Plasma Glucose: 177.16 mg/dL

## 2022-04-02 LAB — BASIC METABOLIC PANEL
Anion gap: 11 (ref 5–15)
BUN: 15 mg/dL (ref 6–20)
CO2: 22 mmol/L (ref 22–32)
Calcium: 9.3 mg/dL (ref 8.9–10.3)
Chloride: 106 mmol/L (ref 98–111)
Creatinine, Ser: 0.77 mg/dL (ref 0.61–1.24)
GFR, Estimated: >60 mL/min (ref >60)
Glucose, Bld: 142 mg/dL — ABNORMAL HIGH (ref 70–99)
Potassium: 4.3 mmol/L (ref 3.5–5.1)
Sodium: 139 mmol/L (ref 135–145)

## 2022-04-03 ENCOUNTER — Encounter (HOSPITAL_COMMUNITY)
Admission: RE | Disposition: A | Discharge status: Home / Self Care | Payer: Self-pay | Source: Ambulatory Visit | Attending: Urology

## 2022-04-03 ENCOUNTER — Encounter (HOSPITAL_COMMUNITY): Payer: Self-pay | Admitting: Urology

## 2022-04-03 ENCOUNTER — Ambulatory Visit (HOSPITAL_COMMUNITY): Payer: Managed Care, Other (non HMO) | Admitting: Certified Registered Nurse Anesthetist

## 2022-04-03 ENCOUNTER — Ambulatory Visit (HOSPITAL_BASED_OUTPATIENT_CLINIC_OR_DEPARTMENT_OTHER): Payer: Managed Care, Other (non HMO) | Admitting: Certified Registered Nurse Anesthetist

## 2022-04-03 ENCOUNTER — Ambulatory Visit (HOSPITAL_COMMUNITY)
Admission: RE | Admit: 2022-04-03 | Discharge: 2022-04-03 | Disposition: A | Discharge status: Home / Self Care | Payer: Managed Care, Other (non HMO) | Source: Ambulatory Visit | Attending: Urology | Admitting: Urology

## 2022-04-03 DIAGNOSIS — I10 Essential (primary) hypertension: Secondary | ICD-10-CM | POA: Diagnosis not present

## 2022-04-03 DIAGNOSIS — N401 Enlarged prostate with lower urinary tract symptoms: Secondary | ICD-10-CM | POA: Insufficient documentation

## 2022-04-03 DIAGNOSIS — N3289 Other specified disorders of bladder: Secondary | ICD-10-CM | POA: Insufficient documentation

## 2022-04-03 DIAGNOSIS — N400 Enlarged prostate without lower urinary tract symptoms: Secondary | ICD-10-CM | POA: Diagnosis not present

## 2022-04-03 DIAGNOSIS — N138 Other obstructive and reflux uropathy: Secondary | ICD-10-CM

## 2022-04-03 DIAGNOSIS — N529 Male erectile dysfunction, unspecified: Secondary | ICD-10-CM | POA: Diagnosis not present

## 2022-04-03 DIAGNOSIS — F17210 Nicotine dependence, cigarettes, uncomplicated: Secondary | ICD-10-CM | POA: Diagnosis not present

## 2022-04-03 HISTORY — PX: CYSTOSCOPY WITH INSERTION OF UROLIFT: SHX6678

## 2022-04-03 LAB — GLUCOSE, CAPILLARY
Glucose-Capillary: 161 mg/dL — ABNORMAL HIGH (ref 70–99)
Glucose-Capillary: 160 mg/dL — ABNORMAL HIGH (ref 70–99)

## 2022-04-03 SURGERY — CYSTOSCOPY WITH INSERTION OF UROLIFT
Anesthesia: General | Site: Prostate

## 2022-04-03 MED ORDER — CIPROFLOXACIN HCL 500 MG PO TABS: 500.0000 mg | ORAL_TABLET | Freq: Two times a day (BID) | ORAL | 0 refills | Status: AC

## 2022-04-03 MED ORDER — LIDOCAINE HCL (PF) 2 % IJ SOLN
INTRAMUSCULAR | Status: AC
  Filled 2022-04-03: qty 5

## 2022-04-03 MED ORDER — PHENAZOPYRIDINE HCL 200 MG PO TABS: 200.0000 mg | ORAL_TABLET | Freq: Three times a day (TID) | ORAL | 1 refills | Status: DC | PRN

## 2022-04-03 MED ORDER — ONDANSETRON HCL 4 MG/2ML IJ SOLN
INTRAMUSCULAR | Status: DC | PRN
  Administered 2022-04-03: 4 mg via INTRAVENOUS

## 2022-04-03 MED ORDER — ONDANSETRON HCL 4 MG/2ML IJ SOLN
INTRAMUSCULAR | Status: AC
  Filled 2022-04-03: qty 2

## 2022-04-03 MED ORDER — LIDOCAINE HCL URETHRAL/MUCOSAL 2 % EX GEL
CUTANEOUS | Status: AC
Start: 2022-04-03 — End: ?
  Filled 2022-04-03: qty 10

## 2022-04-03 MED ORDER — CIPROFLOXACIN IN D5W 400 MG/200ML IV SOLN
400.0000 mg | INTRAVENOUS | Status: AC
  Administered 2022-04-03: 400 mg via INTRAVENOUS
  Filled 2022-04-03: qty 200

## 2022-04-03 MED ORDER — PROPOFOL 10 MG/ML IV BOLUS
INTRAVENOUS | Status: AC
Start: 2022-04-03 — End: ?
  Filled 2022-04-03: qty 20

## 2022-04-03 MED ORDER — LACTATED RINGERS IV SOLN: INTRAVENOUS | Status: DC

## 2022-04-03 MED ORDER — MIDAZOLAM HCL 2 MG/2ML IJ SOLN
INTRAMUSCULAR | Status: AC
  Filled 2022-04-03: qty 2

## 2022-04-03 MED ORDER — PROPOFOL 10 MG/ML IV BOLUS
INTRAVENOUS | Status: DC | PRN
  Administered 2022-04-03: 200 mg via INTRAVENOUS

## 2022-04-03 MED ORDER — LIDOCAINE HCL (CARDIAC) PF 100 MG/5ML IV SOSY
PREFILLED_SYRINGE | INTRAVENOUS | Status: DC | PRN
  Administered 2022-04-03: 80 mg via INTRAVENOUS

## 2022-04-03 MED ORDER — WATER FOR IRRIGATION, STERILE IR SOLN
Status: DC | PRN
  Administered 2022-04-03: 500 mL

## 2022-04-03 MED ORDER — MIDAZOLAM HCL 2 MG/2ML IJ SOLN
INTRAMUSCULAR | Status: DC | PRN
  Administered 2022-04-03: 2 mg via INTRAVENOUS

## 2022-04-03 MED ORDER — STERILE WATER FOR IRRIGATION IR SOLN
Status: DC | PRN
  Administered 2022-04-03 (×2): 3000 mL

## 2022-04-03 MED ORDER — CHLORHEXIDINE GLUCONATE 0.12 % MT SOLN
15.0000 mL | Freq: Once | OROMUCOSAL | Status: AC
  Administered 2022-04-03: 15 mL via OROMUCOSAL

## 2022-04-03 MED ORDER — FENTANYL CITRATE PF 50 MCG/ML IJ SOSY
25.0000 ug | PREFILLED_SYRINGE | INTRAMUSCULAR | Status: DC | PRN
  Administered 2022-04-03: 50 ug via INTRAVENOUS
  Filled 2022-04-03: qty 1

## 2022-04-03 MED ORDER — FENTANYL CITRATE (PF) 100 MCG/2ML IJ SOLN
INTRAMUSCULAR | Status: AC
  Filled 2022-04-03: qty 2

## 2022-04-03 MED ORDER — ONDANSETRON HCL 4 MG/2ML IJ SOLN: 4.0000 mg | Freq: Once | INTRAMUSCULAR | Status: DC | PRN

## 2022-04-03 MED ORDER — FENTANYL CITRATE (PF) 250 MCG/5ML IJ SOLN
INTRAMUSCULAR | Status: DC | PRN
  Administered 2022-04-03 (×4): 25 ug via INTRAVENOUS

## 2022-04-03 MED ORDER — PROPOFOL 10 MG/ML IV BOLUS
INTRAVENOUS | Status: AC
  Filled 2022-04-03: qty 20

## 2022-04-03 MED ORDER — ORAL CARE MOUTH RINSE: 15.0000 mL | Freq: Once | OROMUCOSAL | Status: AC

## 2022-04-03 SURGICAL SUPPLY — 19 items
BAG DRAIN URO TABLE W/ADPT NS (BAG) ×2 IMPLANT
BAG DRN 8 ADPR NS SKTRN CSTL (BAG) ×1
BAG HAMPER (MISCELLANEOUS) ×2 IMPLANT
CLOTH BEACON ORANGE TIMEOUT ST (SAFETY) ×2 IMPLANT
GLOVE BIO SURGEON STRL SZ8 (GLOVE) ×2 IMPLANT
GLOVE BIOGEL PI IND STRL 7.0 (GLOVE) ×2 IMPLANT
GLOVE BIOGEL PI INDICATOR 7.0 (GLOVE) ×2
GOWN STRL REUS W/TWL LRG LVL3 (GOWN DISPOSABLE) ×2 IMPLANT
GOWN STRL REUS W/TWL XL LVL3 (GOWN DISPOSABLE) ×2 IMPLANT
KIT TURNOVER CYSTO (KITS) ×2 IMPLANT
MANIFOLD NEPTUNE II (INSTRUMENTS) ×2 IMPLANT
PACK CYSTO (CUSTOM PROCEDURE TRAY) ×2 IMPLANT
PAD ARMBOARD 7.5X6 YLW CONV (MISCELLANEOUS) ×2 IMPLANT
SYSTEM UROLIFT (Male Continence) ×7 IMPLANT
TOWEL OR 17X26 4PK STRL BLUE (TOWEL DISPOSABLE) ×2 IMPLANT
TRAY FOLEY W/BAG SLVR 16FR (SET/KITS/TRAYS/PACK) ×2
TRAY FOLEY W/BAG SLVR 16FR ST (SET/KITS/TRAYS/PACK) ×1 IMPLANT
WATER STERILE IRR 3000ML UROMA (IV SOLUTION) ×3 IMPLANT
WATER STERILE IRR 500ML POUR (IV SOLUTION) ×2 IMPLANT

## 2022-04-03 NOTE — H&P (Signed)
Assessment: 1. BPH with obstruction/lower urinary tract symptoms   2. Organic impotence       Plan: Diagnosis and management options for BPH with obstruction discussed.  He has not responded to medical therapy.  I discussed options including minimally invasive procedures (UroLift) as well as surgical management with TURP, PVP, simple prostatectomy, or HoLEP. He would like to proceed with UroLift.  Risks and benefits discussed.  Information on procedure provided to patient today. Continue tamsulosin, finasteride, and tadalafil. Plan for UroLift today   Procedure: The patient will be scheduled for cystoscopy, UroLift procedure at [facility].  Surgical request is placed with the surgery schedulers and will be scheduled at the patient's/family request. Informed consent is given as documented below. Anesthesia: MAC   The patient does not have sleep apnea, history of MRSA, history of VRE, history of cardiac device requiring special anesthetic needs. Patient is stable and considered clear for surgical in an outpatient ambulatory surgery setting as well as patient hospital setting.   Consent for Operation or Procedure: Provider Certification I hereby certify that the nature, purpose, benefits, usual and most frequent risks of, and alternatives to, the operation or procedure have been explained to the patient (or person authorized to sign for the patient) either by me as responsible physician or by the provider who is to perform the operation or procedure. Time spent such that the patient/family has had an opportunity to ask questions, and that those questions have been answered. The patient or the patient's representative has been advised that selected tasks may be performed by assistants to the primary health care provider(s). I believe that the patient (or person authorized to sign for the patient) understands what has been explained, and has consented to the operation or procedure. No guarantees  were implied or made.       Chief Complaint:     Chief Complaint  Patient presents with   Benign Prostatic Hypertrophy      History of Present Illness:   Mark Kim is a 61 y.o. year old male who is seen for evaluation of BPH with obstruction and organic impotence.  He was previously followed in [facility] with his last visit in June 2021. He discontinued Rapaflo and resumed tamsulosin.  He does not feel like his urinary symptoms are well controlled.  He reports urinary frequency, nocturia 1-2 times, intermittent stream, decreased force of stream, urgency, and sensation of incomplete emptying.  No dysuria or gross hematuria. He was started on finasteride in January 2023. IPSS = 22.  PVR = 222 ml PSA from 1/23: 0.91  He also continues to have problems achieving and maintaining his erections.  He has noted some pain associated with his erections as well as a dorsal curvature.  This has been ongoing for the past 18 months.  He is able to achieve an erection and use this for intercourse with the assistance of daily tadalafil.  He does report a decreased libido. Testosterone 3/23:  629 His urinary symptoms remained unchanged.  He continued with sensation of incomplete emptying, decreased force of stream, frequency, urgency, and intermittent stream.  No dysuria or gross hematuria. IPSS = 17. Cystoscopy demonstrated lateral lobe enlargement of the prostate without a median lobe, 1+ bladder trabeculations. He underwent prostate ultrasound today for surgical planning.   TRUS volume: 28 ml   He continud on tamsulosin, finasteride, and tadalafil.  His urinary symptoms remained unchanged with a decreased force of stream and sensation of incomplete emptying.  No dysuria or gross hematuria. IPSS = 14.  PVR = 65 ml.   Urologic History: He has a history of BPH with obstruction.  He previously had tried alfuzosin but did not notice significant benefit and had ejaculatory problems. He was taking  Cialis 5 mg daily with good results as well as Rapaflo. Due to worsening of his lower urinary tract symptoms, he was restarted on tamsulosin in October 2020. AUA score = 13. PSA 10/20: 0.78 He was changed back to Rapaflo at his visit in April 2021. He noted improvement in his urinary symptoms with the Rapaflo. No side effects. AUA score = 6.  He has a history of difficulty achieving and maintaining his erections. No pain or curvature with erection. He also had premature ejaculation, occurring after 2 minutes of penetration. He was on tadalafil 5 mg daily. He was given a trial of tadalafil 20 mg as needed at his visit in April 2021. He noted improvement in his erections with the increased dose of tadalafil. He also noted improvement in the premature ejaculation.   He continued on Rapaflo 8 mg daily and tadalafil 20 mg as needed at his visit in June 2021.     Past Medical History:      Past Medical History:  Diagnosis Date   Diabetes mellitus without complication ([place])     Hypertension        Past Surgical History:       Past Surgical History:  Procedure Laterality Date   ORCHIECTOMY          Allergies:  No Known Allergies   Family History:  No family history on file.   Social History:  Social History         Tobacco Use   Smoking status: Every Day      Types: Cigarettes   Smokeless tobacco: Never  Substance Use Topics   Alcohol use: Not Currently      ROS: Constitutional:  Negative for fever, chills, weight loss CV: Negative for chest pain, previous MI, hypertension Respiratory:  Negative for shortness of breath, wheezing, sleep apnea, frequent cough GI:  Negative for nausea, vomiting, bloody stool, GERD   Physical exam: GENERAL APPEARANCE:  Well appearing, well developed, well nourished, NAD HEENT: Atraumatic, Normocephalic, oropharynx clear. NECK: Supple without lymphadenopathy or thyromegaly. LUNGS: Clear to auscultation bilaterally. HEART: Regular Rate and  Rhythm without murmurs, gallops, or rubs. ABDOMEN: Soft, non-tender, No Masses. EXTREMITIES: Moves all extremities well.  Without clubbing, cyanosis, or edema. NEUROLOGIC:  Alert and oriented x 3, normal gait, CN II-XII grossly intact.  MENTAL STATUS:  Appropriate. BACK:  Non-tender to palpation.  No CVAT SKIN:  Warm, dry and intact.       Results: None

## 2022-04-03 NOTE — Transfer of Care (Signed)
Immediate Anesthesia Transfer of Care Note  Patient: Mark Kim  Procedure(s) Performed: CYSTOSCOPY WITH INSERTION OF UROLIFT (Prostate)  Patient Location: PACU  Anesthesia Type:General  Level of Consciousness: awake  Airway & Oxygen Therapy: Patient Spontanous Breathing and Patient connected to nasal cannula oxygen  Post-op Assessment: Report given to RN and Post -op Vital signs reviewed and stable  Post vital signs: Reviewed and stable  Last Vitals:  Vitals Value Taken Time  BP 130/89   Temp    Pulse 69   Resp 10   SpO2 94     Last Pain:  Vitals:   04/03/22 0710  PainSc: 0-No pain      Patients Stated Pain Goal: 5 (04/03/22 0710)  Complications: No notable events documented.

## 2022-04-03 NOTE — Op Note (Signed)
OPERATIVE NOTE   Patient Name: Mark Kim  MRN:  [id number]  Date of Procedure: 04/03/22  Preoperative diagnosis:  BPH with obstruction  Postoperative diagnosis:  BPH with obstruction  Procedure:  Cystoscopy UroLift insertion  Attending: Milderd Meager, MD  Anesthesia: General with LMA  Estimated blood loss: Minimal  Fluids: Per anesthesia record  Drains: 16 French Foley catheter  Specimens: None  Antibiotics: Cipro 400 mg IV  Findings: Lateral lobe enlargement of the prostate with small median lobe; normal bladder  Indications:  62 year old white male with BPH with obstruction presents for surgical management with cystoscopy and UroLift insertion.  He has not responded to medical therapy with alpha blockers.  He has continued to have significant lower urinary tract symptoms.  Evaluation with cystoscopy demonstrated lateral lobe enlargement of the prostate without a significant median lobe and 1+ bladder trabeculations.  Prostate volume measured 20 mL on ultrasound.  Treatment options were discussed with the patient.  He presents now for cystoscopy and UroLift insertion.  Risk and benefits of the procedure were discussed in detail.  He understands wishes to proceed as described.  Description of Procedure:  Patient received IV ciprofloxacin preoperatively.  After successful induction of a general anesthetic, the patient was placed in the dorsolithotomy position.  The patient's genitalia was prepped and draped in sterile fashion.  Under direct visualization, a 22 French rigid cystoscope was passed through the urethra and into the bladder.  The patient was noted to have lateral lobe enlargement of the prostate with some elevation of the bladder neck.  Examination of the bladder demonstrated scattered trabeculations but no mucosal abnormalities.  A normal-appearing trigone was seen with a single orifice bilaterally.   The cystoscopy bridge was replaced with a UroLift  delivery device.  UroLift implants were placed approximately 2 cm distal to the bladder neck at the 2:00 and 10:00 positions creating an anterior channel proximally.  2 additional UroLift implants were placed at the level of the verumontanum anteriorly.  Following placement of these implants, inspection showed some persistent obstructing tissue on the patient's right side.  2 additional implants were placed anteriorly to further open up the anterior channel.  Cystoscopy showed some obstructing tissue with a small median lobe which was not readily apparent initially.  A single UroLift implant was placed on the patient's left side to pull the median lobe tissue to the left.  An excellent anterior channel was identified.  A total of 7 implants were placed with 4 on the right and 3 on the left.  Inspection of the bladder showed no UroLift implants within the bladder.  Minimal bleeding was noted.  A 16 French Foley was placed at the completion of the procedure.  The patient was then extubated and taken to the postanesthesia care unit in stable condition.  Complications: None  Condition: Stable, extubated, transferred to PACU  Plan:  Foley removal in the PACU if urine remains clear Voiding trial prior to discharge Return to office in 2 weeks with bladder scan.

## 2022-04-03 NOTE — Interval H&P Note (Signed)
History and Physical Interval Note:  04/03/2022 7:09 AM  Mark Kim  has presented today for surgery, with the diagnosis of BPH with obstruction.  The various methods of treatment have been discussed with the patient and family. After consideration of risks, benefits and other options for treatment, the patient has consented to  Procedure(s): CYSTOSCOPY WITH INSERTION OF UROLIFT (N/A) as a surgical intervention.  The patient's history has been reviewed, patient examined, no change in status, stable for surgery.  I have reviewed the patient's chart and labs.  Questions were answered to the patient's satisfaction.     Michaelle Birks

## 2022-04-03 NOTE — Anesthesia Procedure Notes (Signed)
Procedure Name: LMA Insertion Date/Time: 04/03/2022 7:31 AM Performed by: Lorin Glass, CRNA Pre-anesthesia Checklist: Emergency Drugs available, Patient identified, Suction available and Patient being monitored Patient Re-evaluated:Patient Re-evaluated prior to induction Oxygen Delivery Method: Circle system utilized Preoxygenation: Pre-oxygenation with 100% oxygen Induction Type: IV induction LMA: LMA inserted LMA Size: 4.0 Number of attempts: 1 Placement Confirmation: positive ETCO2 and breath sounds checked- equal and bilateral Tube secured with: Tape Dental Injury: Teeth and Oropharynx as per pre-operative assessment

## 2022-04-03 NOTE — Anesthesia Preprocedure Evaluation (Signed)
Anesthesia Evaluation  Patient identified by MRN, date of birth, ID band Patient awake    Reviewed: Allergy & Precautions, H&P , NPO status , Patient's Chart, lab work & pertinent test results, reviewed documented beta blocker date and time   Airway Mallampati: II  TM Distance: >3 FB Neck ROM: full    Dental no notable dental hx.    Pulmonary neg pulmonary ROS, Current Smoker,    Pulmonary exam normal breath sounds clear to auscultation       Cardiovascular Exercise Tolerance: Good hypertension, negative cardio ROS   Rhythm:regular Rate:Normal     Neuro/Psych negative neurological ROS  negative psych ROS   GI/Hepatic negative GI ROS, Neg liver ROS,   Endo/Other  negative endocrine ROSdiabetes, Poorly Controlled, Type 2  Renal/GU negative Renal ROS  negative genitourinary   Musculoskeletal   Abdominal   Peds  Hematology negative hematology ROS (+)   Anesthesia Other Findings   Reproductive/Obstetrics negative OB ROS                             Anesthesia Physical Anesthesia Plan  ASA: 2  Anesthesia Plan: General and General LMA   Post-op Pain Management:    Induction:   PONV Risk Score and Plan: Ondansetron  Airway Management Planned:   Additional Equipment:   Intra-op Plan:   Post-operative Plan:   Informed Consent: I have reviewed the patients History and Physical, chart, labs and discussed the procedure including the risks, benefits and alternatives for the proposed anesthesia with the patient or authorized representative who has indicated his/her understanding and acceptance.     Dental Advisory Given  Plan Discussed with: CRNA  Anesthesia Plan Comments:         Anesthesia Quick Evaluation

## 2022-04-04 NOTE — Anesthesia Postprocedure Evaluation (Signed)
Anesthesia Post Note  Patient: Mark Kim  Procedure(s) Performed: CYSTOSCOPY WITH INSERTION OF UROLIFT (Prostate)  Patient location during evaluation: Phase II Anesthesia Type: General Level of consciousness: awake Pain management: pain level controlled Vital Signs Assessment: post-procedure vital signs reviewed and stable Respiratory status: spontaneous breathing and respiratory function stable Cardiovascular status: blood pressure returned to baseline and stable Postop Assessment: no headache and no apparent nausea or vomiting Anesthetic complications: no Comments: Late entry   No notable events documented.   Last Vitals:  Vitals:   04/03/22 0900 04/03/22 0929  BP: 135/86 (!) 151/102  Pulse: 74 69  Resp: 12 18  Temp:  36.4 C  SpO2: 94% 99%    Last Pain:  Vitals:   04/03/22 0929  TempSrc: Oral  PainSc: 3                  Windell Norfolk

## 2022-04-08 ENCOUNTER — Encounter (HOSPITAL_COMMUNITY): Payer: Self-pay | Admitting: Urology

## 2022-04-18 ENCOUNTER — Ambulatory Visit: Payer: Managed Care, Other (non HMO) | Admitting: Urology

## 2022-04-23 ENCOUNTER — Encounter: Payer: Self-pay | Admitting: Urology

## 2022-04-23 ENCOUNTER — Ambulatory Visit (INDEPENDENT_AMBULATORY_CARE_PROVIDER_SITE_OTHER): Payer: Managed Care, Other (non HMO) | Admitting: Urology

## 2022-04-23 VITALS — BP 122/82 | HR 67

## 2022-04-23 DIAGNOSIS — N401 Enlarged prostate with lower urinary tract symptoms: Secondary | ICD-10-CM | POA: Diagnosis not present

## 2022-04-23 DIAGNOSIS — N138 Other obstructive and reflux uropathy: Secondary | ICD-10-CM

## 2022-04-23 LAB — URINALYSIS, ROUTINE W REFLEX MICROSCOPIC
Bilirubin, UA: NEGATIVE
Ketones, UA: NEGATIVE
Leukocytes,UA: NEGATIVE
Nitrite, UA: NEGATIVE
RBC, UA: NEGATIVE
Specific Gravity, UA: 1.02 (ref 1.005–1.030)
Urobilinogen, Ur: 1 mg/dL (ref 0.2–1.0)
pH, UA: 5.5 (ref 5.0–7.5)

## 2022-04-23 LAB — MICROSCOPIC EXAMINATION
Bacteria, UA: NONE SEEN
Epithelial Cells (non renal): NONE SEEN /hpf (ref 0–10)
RBC, Urine: NONE SEEN /hpf (ref 0–2)
Renal Epithel, UA: NONE SEEN /hpf
WBC, UA: NONE SEEN /hpf (ref 0–5)

## 2022-04-23 LAB — BLADDER SCAN AMB NON-IMAGING: Scan Result: 72

## 2022-04-23 MED ORDER — PHENAZOPYRIDINE HCL 200 MG PO TABS: 200.0000 mg | ORAL_TABLET | Freq: Three times a day (TID) | ORAL | 1 refills | Status: AC | PRN

## 2022-06-06 ENCOUNTER — Ambulatory Visit: Payer: Managed Care, Other (non HMO) | Admitting: Urology

## 2023-03-13 ENCOUNTER — Other Ambulatory Visit: Payer: Self-pay | Admitting: Urology

## 2023-06-11 ENCOUNTER — Other Ambulatory Visit: Payer: Self-pay | Admitting: Urology

## 2023-06-12 ENCOUNTER — Telehealth: Payer: Self-pay | Admitting: Urology

## 2023-06-12 NOTE — Telephone Encounter (Signed)
LVM for pt to call us and sch a follow up

## 2023-06-18 ENCOUNTER — Ambulatory Visit: Payer: Managed Care, Other (non HMO) | Admitting: Urology

## 2023-06-18 ENCOUNTER — Encounter: Payer: Self-pay | Admitting: Urology

## 2023-06-18 VITALS — BP 135/82 | HR 63 | Ht 73.0 in | Wt 240.0 lb

## 2023-06-18 DIAGNOSIS — N486 Induration penis plastica: Secondary | ICD-10-CM

## 2023-06-18 DIAGNOSIS — N138 Other obstructive and reflux uropathy: Secondary | ICD-10-CM | POA: Insufficient documentation

## 2023-06-18 DIAGNOSIS — N401 Enlarged prostate with lower urinary tract symptoms: Secondary | ICD-10-CM | POA: Insufficient documentation

## 2023-06-18 LAB — URINALYSIS, ROUTINE W REFLEX MICROSCOPIC
Bilirubin, UA: NEGATIVE
Ketones, UA: NEGATIVE
Leukocytes,UA: NEGATIVE
Nitrite, UA: NEGATIVE
Protein,UA: NEGATIVE
RBC, UA: NEGATIVE
Specific Gravity, UA: 1.015 (ref 1.005–1.030)
Urobilinogen, Ur: 0.2 mg/dL (ref 0.2–1.0)
pH, UA: 6 (ref 5.0–7.5)

## 2023-06-18 LAB — BLADDER SCAN AMB NON-IMAGING

## 2023-06-18 MED ORDER — SILODOSIN 8 MG PO CAPS: 8.0000 mg | ORAL_CAPSULE | Freq: Every day | ORAL | 3 refills | Status: DC

## 2023-06-18 NOTE — Progress Notes (Signed)
Assessment: 1. BPH with obstruction/lower urinary tract symptoms   2. Peyronie's disease     Plan: Continue silodosin.  Refill sent. Management options for Peyronie's disease discussed including Xiaflex and surgical therapy. Information on Xiaflex provided. I discussed referral to Dr. Lafonda Mosses at [facility] for further management of his Peyronie's Return to office in 1 year  Chief Complaint:  Chief Complaint  Patient presents with   Benign Prostatic Hypertrophy    History of Present Illness:  Mark Kim is a 62 y.o. year old male who is seen for evaluation of BPH with obstruction and organic impotence.  He was previously followed in [facility] with his last visit in June 2021. He discontinued Rapaflo and resumed tamsulosin.  He does not feel like his urinary symptoms are well controlled.  He reports urinary frequency, nocturia 1-2 times, intermittent stream, decreased force of stream, urgency, and sensation of incomplete emptying.  No dysuria or gross hematuria. He was started on finasteride in January 2023. IPSS = 22.  PVR = 222 ml PSA from 1/23: 0.91  He also continues to have problems achieving and maintaining his erections.  He has noted some pain associated with his erections as well as a dorsal curvature.  This has been ongoing for the past 18 months.  He is able to achieve an erection and use this for intercourse with the assistance of daily tadalafil.  He does report a decreased libido. Testosterone 3/23:  629 His urinary symptoms remained unchanged.  He continued with sensation of incomplete emptying, decreased force of stream, frequency, urgency, and intermittent stream.  No dysuria or gross hematuria. IPSS = 17. Cystoscopy demonstrated lateral lobe enlargement of the prostate without a median lobe, 1+ bladder trabeculations. He underwent prostate ultrasound today for surgical planning.   TRUS volume: 28 ml  He continued on tamsulosin, finasteride, and  tadalafil.  His urinary symptoms are unchanged.  He continued to report a decreased force of stream and sensation of incomplete emptying.  No dysuria or gross hematuria. IPSS = 14.  He underwent a UroLift on 04/03/22.  A total of 7 implants were placed. At his visit in June 2023, he continued on silodosin.  He had noted improvement in his frequency and nocturia.  He continued to have some dysuria relieved with Pyridium.  No gross hematuria.  He noted improvement in his stream. PVR = 72 ml  He returns today for follow-up.  He continues on silodosin.  He reports that his urinary symptoms are fairly stable.  He has nocturia x 1.  He does have some hesitancy and intermittent stream, primarily at night.  No dysuria or gross hematuria. He has not used tadalafil recently.  He continues to report a dorsal curvature of the penis with erections.  He has noted some worsening of the curvature.  PSA 7/24:  1.35  Portions of the above documentation were copied from a prior visit for review purposes only.   Past Medical History:  Past Medical History:  Diagnosis Date   Arthritis    Diabetes mellitus without complication (HCC)    Hypertension     Past Surgical History:  Past Surgical History:  Procedure Laterality Date   CYSTOSCOPY WITH INSERTION OF UROLIFT N/A 04/03/2022   Procedure: CYSTOSCOPY WITH INSERTION OF UROLIFT;  Surgeon: Milderd Meager., MD;  Location: [facility];  Service: Urology;  Laterality: N/A;   ORCHIECTOMY      Allergies:  No Known Allergies  Family History:  No family history  on file.  Social History:  Social History   Tobacco Use   Smoking status: Every Day    Types: Cigarettes   Smokeless tobacco: Never  Substance Use Topics   Alcohol use: Not Currently   Drug use: Not Currently    ROS: Constitutional:  Negative for fever, chills, weight loss CV: Negative for chest pain, previous MI, hypertension Respiratory:  Negative for shortness of breath, wheezing, sleep  apnea, frequent cough GI:  Negative for nausea, vomiting, bloody stool, GERD  Physical exam: BP 135/82   Pulse 63   Ht 6\' 1"  (1.854 m)   Wt 240 lb (108.9 kg)   BMI 31.66 kg/m  GENERAL APPEARANCE:  Well appearing, well developed, well nourished, NAD HEENT:  Atraumatic, normocephalic, oropharynx clear NECK:  Supple without lymphadenopathy or thyromegaly ABDOMEN:  Soft, non-tender, no masses EXTREMITIES:  Moves all extremities well, without clubbing, cyanosis, or edema NEUROLOGIC:  Alert and oriented x 3, normal gait, CN II-XII grossly intact MENTAL STATUS:  appropriate BACK:  Non-tender to palpation, No CVAT SKIN:  Warm, dry, and intact GU: Penis:  uncircumcised; dorsal plaque palpated Meatus: Normal Scrotum: normal, no masses Testis: solitary testicle Prostate: 40 g, NT, no nodules Rectum: Normal tone,  no masses or tenderness   Results: U/A: 3+ glucose  PVR = 86 ml

## 2024-08-30 ENCOUNTER — Other Ambulatory Visit: Payer: Self-pay | Admitting: Urology

## 2024-08-30 DIAGNOSIS — N138 Other obstructive and reflux uropathy: Secondary | ICD-10-CM

## 2024-09-01 ENCOUNTER — Other Ambulatory Visit: Payer: Self-pay | Admitting: Urology

## 2024-09-01 ENCOUNTER — Telehealth: Payer: Self-pay | Admitting: Urology

## 2024-09-01 DIAGNOSIS — N401 Enlarged prostate with lower urinary tract symptoms: Secondary | ICD-10-CM

## 2024-09-01 NOTE — Telephone Encounter (Signed)
 Called pt to schedule a follow up per [place] for medication refills

## 2024-11-24 ENCOUNTER — Other Ambulatory Visit: Payer: Self-pay | Admitting: Urology

## 2024-11-24 DIAGNOSIS — N401 Enlarged prostate with lower urinary tract symptoms: Secondary | ICD-10-CM
